# Patient Record
Sex: Female | Born: 1960 | Race: White | Hispanic: No | Marital: Married
Health system: Southern US, Community
[De-identification: ages and names within clinical notes are randomized; demographics above are authoritative.]

---

## 2012-09-08 ENCOUNTER — Other Ambulatory Visit: Payer: Self-pay | Admitting: Family Medicine

## 2012-09-08 DIAGNOSIS — Z1231 Encounter for screening mammogram for malignant neoplasm of breast: Secondary | ICD-10-CM

## 2012-10-20 ENCOUNTER — Ambulatory Visit
Admission: RE | Admit: 2012-10-20 | Discharge: 2012-10-20 | Disposition: A | Discharge status: Home / Self Care | Payer: BC Managed Care – PPO | Source: Ambulatory Visit | Attending: Family Medicine | Admitting: Family Medicine

## 2012-10-20 DIAGNOSIS — Z1231 Encounter for screening mammogram for malignant neoplasm of breast: Secondary | ICD-10-CM

## 2012-10-21 ENCOUNTER — Other Ambulatory Visit: Payer: Self-pay | Admitting: Family Medicine

## 2012-10-21 DIAGNOSIS — R928 Other abnormal and inconclusive findings on diagnostic imaging of breast: Secondary | ICD-10-CM

## 2012-11-02 ENCOUNTER — Other Ambulatory Visit: Payer: BC Managed Care – PPO

## 2012-11-19 ENCOUNTER — Ambulatory Visit
Admission: RE | Admit: 2012-11-19 | Discharge: 2012-11-19 | Disposition: A | Discharge status: Home / Self Care | Payer: BC Managed Care – PPO | Source: Ambulatory Visit | Attending: Family Medicine | Admitting: Family Medicine

## 2012-11-19 DIAGNOSIS — R928 Other abnormal and inconclusive findings on diagnostic imaging of breast: Secondary | ICD-10-CM

## 2013-09-09 ENCOUNTER — Other Ambulatory Visit (HOSPITAL_COMMUNITY)
Admission: RE | Admit: 2013-09-09 | Discharge: 2013-09-09 | Disposition: A | Discharge status: Home / Self Care | Payer: 59 | Source: Ambulatory Visit | Attending: Family Medicine | Admitting: Family Medicine

## 2013-09-09 ENCOUNTER — Other Ambulatory Visit: Payer: Self-pay | Admitting: Family Medicine

## 2013-09-09 DIAGNOSIS — Z1151 Encounter for screening for human papillomavirus (HPV): Secondary | ICD-10-CM | POA: Insufficient documentation

## 2013-09-09 DIAGNOSIS — Z0000 Encounter for general adult medical examination without abnormal findings: Secondary | ICD-10-CM | POA: Insufficient documentation

## 2013-09-13 LAB — CYTOLOGY - PAP

## 2013-12-19 ENCOUNTER — Other Ambulatory Visit: Payer: Self-pay

## 2013-12-19 DIAGNOSIS — Z1231 Encounter for screening mammogram for malignant neoplasm of breast: Secondary | ICD-10-CM

## 2013-12-28 ENCOUNTER — Ambulatory Visit
Admission: RE | Admit: 2013-12-28 | Discharge: 2013-12-28 | Disposition: A | Discharge status: Home / Self Care | Payer: 59 | Source: Ambulatory Visit

## 2013-12-28 ENCOUNTER — Encounter (INDEPENDENT_AMBULATORY_CARE_PROVIDER_SITE_OTHER): Payer: Self-pay

## 2013-12-28 DIAGNOSIS — Z1231 Encounter for screening mammogram for malignant neoplasm of breast: Secondary | ICD-10-CM

## 2016-02-19 ENCOUNTER — Other Ambulatory Visit: Payer: Self-pay | Admitting: Family Medicine

## 2016-02-19 DIAGNOSIS — Z1231 Encounter for screening mammogram for malignant neoplasm of breast: Secondary | ICD-10-CM

## 2016-02-27 ENCOUNTER — Ambulatory Visit
Admission: RE | Admit: 2016-02-27 | Discharge: 2016-02-27 | Disposition: A | Discharge status: Home / Self Care | Payer: 59 | Source: Ambulatory Visit | Attending: Family Medicine | Admitting: Family Medicine

## 2016-02-27 DIAGNOSIS — Z1231 Encounter for screening mammogram for malignant neoplasm of breast: Secondary | ICD-10-CM

## 2016-12-02 DIAGNOSIS — Z0000 Encounter for general adult medical examination without abnormal findings: Secondary | ICD-10-CM | POA: Diagnosis not present

## 2016-12-02 DIAGNOSIS — E782 Mixed hyperlipidemia: Secondary | ICD-10-CM | POA: Diagnosis not present

## 2016-12-02 DIAGNOSIS — I10 Essential (primary) hypertension: Secondary | ICD-10-CM | POA: Diagnosis not present

## 2017-02-17 ENCOUNTER — Other Ambulatory Visit: Payer: Self-pay | Admitting: Family Medicine

## 2017-02-17 DIAGNOSIS — Z1231 Encounter for screening mammogram for malignant neoplasm of breast: Secondary | ICD-10-CM

## 2017-03-17 ENCOUNTER — Ambulatory Visit
Admission: RE | Admit: 2017-03-17 | Discharge: 2017-03-17 | Disposition: A | Discharge status: Home / Self Care | Payer: 59 | Source: Ambulatory Visit | Attending: Family Medicine | Admitting: Family Medicine

## 2017-03-17 DIAGNOSIS — Z1231 Encounter for screening mammogram for malignant neoplasm of breast: Secondary | ICD-10-CM

## 2017-10-30 DIAGNOSIS — M1711 Unilateral primary osteoarthritis, right knee: Secondary | ICD-10-CM | POA: Diagnosis not present

## 2017-10-30 DIAGNOSIS — M25561 Pain in right knee: Secondary | ICD-10-CM | POA: Diagnosis not present

## 2017-10-30 DIAGNOSIS — M238X1 Other internal derangements of right knee: Secondary | ICD-10-CM | POA: Diagnosis not present

## 2017-11-10 DIAGNOSIS — M25561 Pain in right knee: Secondary | ICD-10-CM | POA: Diagnosis not present

## 2017-12-02 DIAGNOSIS — M25561 Pain in right knee: Secondary | ICD-10-CM | POA: Diagnosis not present

## 2017-12-02 DIAGNOSIS — S83231A Complex tear of medial meniscus, current injury, right knee, initial encounter: Secondary | ICD-10-CM | POA: Diagnosis not present

## 2017-12-02 DIAGNOSIS — M1711 Unilateral primary osteoarthritis, right knee: Secondary | ICD-10-CM | POA: Diagnosis not present

## 2017-12-11 DIAGNOSIS — M94261 Chondromalacia, right knee: Secondary | ICD-10-CM | POA: Diagnosis not present

## 2017-12-11 DIAGNOSIS — G8918 Other acute postprocedural pain: Secondary | ICD-10-CM | POA: Diagnosis not present

## 2017-12-11 DIAGNOSIS — M23221 Derangement of posterior horn of medial meniscus due to old tear or injury, right knee: Secondary | ICD-10-CM | POA: Diagnosis not present

## 2018-02-05 DIAGNOSIS — M25561 Pain in right knee: Secondary | ICD-10-CM | POA: Diagnosis not present

## 2018-02-05 DIAGNOSIS — M25661 Stiffness of right knee, not elsewhere classified: Secondary | ICD-10-CM | POA: Diagnosis not present

## 2018-02-18 DIAGNOSIS — M25661 Stiffness of right knee, not elsewhere classified: Secondary | ICD-10-CM | POA: Diagnosis not present

## 2018-02-18 DIAGNOSIS — M25561 Pain in right knee: Secondary | ICD-10-CM | POA: Diagnosis not present

## 2018-02-24 DIAGNOSIS — M25561 Pain in right knee: Secondary | ICD-10-CM | POA: Diagnosis not present

## 2019-04-11 ENCOUNTER — Other Ambulatory Visit (HOSPITAL_COMMUNITY)
Admission: RE | Admit: 2019-04-11 | Discharge: 2019-04-11 | Disposition: A | Discharge status: Home / Self Care | Payer: 59 | Source: Ambulatory Visit | Attending: Family Medicine | Admitting: Family Medicine

## 2019-04-11 ENCOUNTER — Other Ambulatory Visit: Payer: Self-pay | Admitting: Family Medicine

## 2019-04-11 DIAGNOSIS — Z124 Encounter for screening for malignant neoplasm of cervix: Secondary | ICD-10-CM | POA: Diagnosis not present

## 2019-04-15 LAB — CYTOLOGY - PAP
Comment: NEGATIVE
Diagnosis: NEGATIVE
High risk HPV: NEGATIVE

## 2019-08-25 ENCOUNTER — Other Ambulatory Visit: Payer: Self-pay | Admitting: Family Medicine

## 2019-08-25 DIAGNOSIS — Z1231 Encounter for screening mammogram for malignant neoplasm of breast: Secondary | ICD-10-CM

## 2019-08-30 ENCOUNTER — Other Ambulatory Visit: Payer: Self-pay

## 2019-08-30 ENCOUNTER — Ambulatory Visit
Admission: RE | Admit: 2019-08-30 | Discharge: 2019-08-30 | Disposition: A | Discharge status: Home / Self Care | Payer: 59 | Source: Ambulatory Visit | Attending: Family Medicine | Admitting: Family Medicine

## 2019-08-30 DIAGNOSIS — Z1231 Encounter for screening mammogram for malignant neoplasm of breast: Secondary | ICD-10-CM

## 2020-08-22 ENCOUNTER — Other Ambulatory Visit (HOSPITAL_COMMUNITY): Payer: Self-pay | Admitting: Family Medicine

## 2020-08-22 DIAGNOSIS — R011 Cardiac murmur, unspecified: Secondary | ICD-10-CM

## 2020-08-29 ENCOUNTER — Encounter (HOSPITAL_COMMUNITY): Payer: Self-pay | Admitting: Family Medicine

## 2020-09-11 ENCOUNTER — Telehealth (HOSPITAL_COMMUNITY): Payer: Self-pay | Admitting: Family Medicine

## 2020-09-11 NOTE — Telephone Encounter (Signed)
Just an FYI. We have made several attempts to contact this patient including sending a letter to schedule or reschedule their echocardiogram. We will be removing the patient from the echo WQ.  08/29/20 MAILED LETTER [facility]  08/29/20 [facility] to schedule @ 9:27/[facility]  08/24/20 [facility] to schedule @ 9:35/[facility]  08/22/20 [facility] to schedule @ 11:59/[facility]      Thank you

## 2021-08-11 IMAGING — MG DIGITAL SCREENING BILAT W/ TOMO W/ CAD
6 of 12 series · 6 of 36 positions shown · non-contrast
Comparison: Previous exam(s).

CLINICAL DATA: Screening.

EXAM:
DIGITAL SCREENING BILATERAL MAMMOGRAM WITH TOMO AND CAD

[R CC synth-2D (1 of 2)]
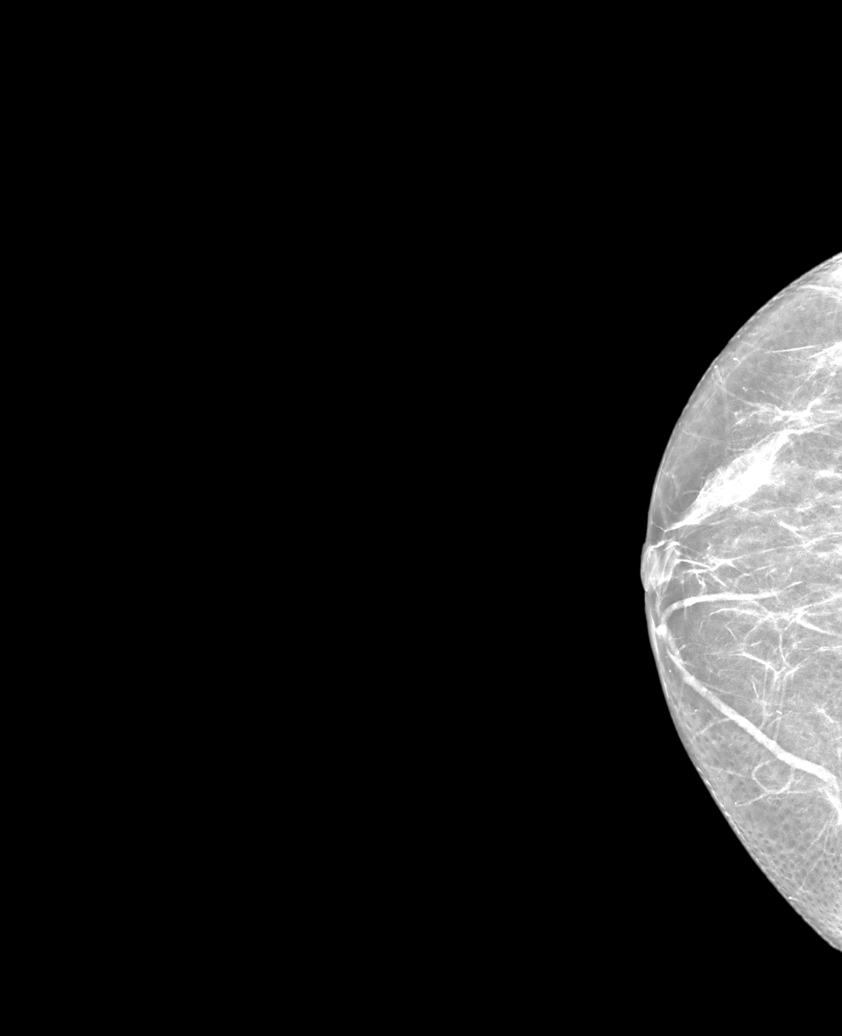

[L CC synth-2D (1 of 2)]
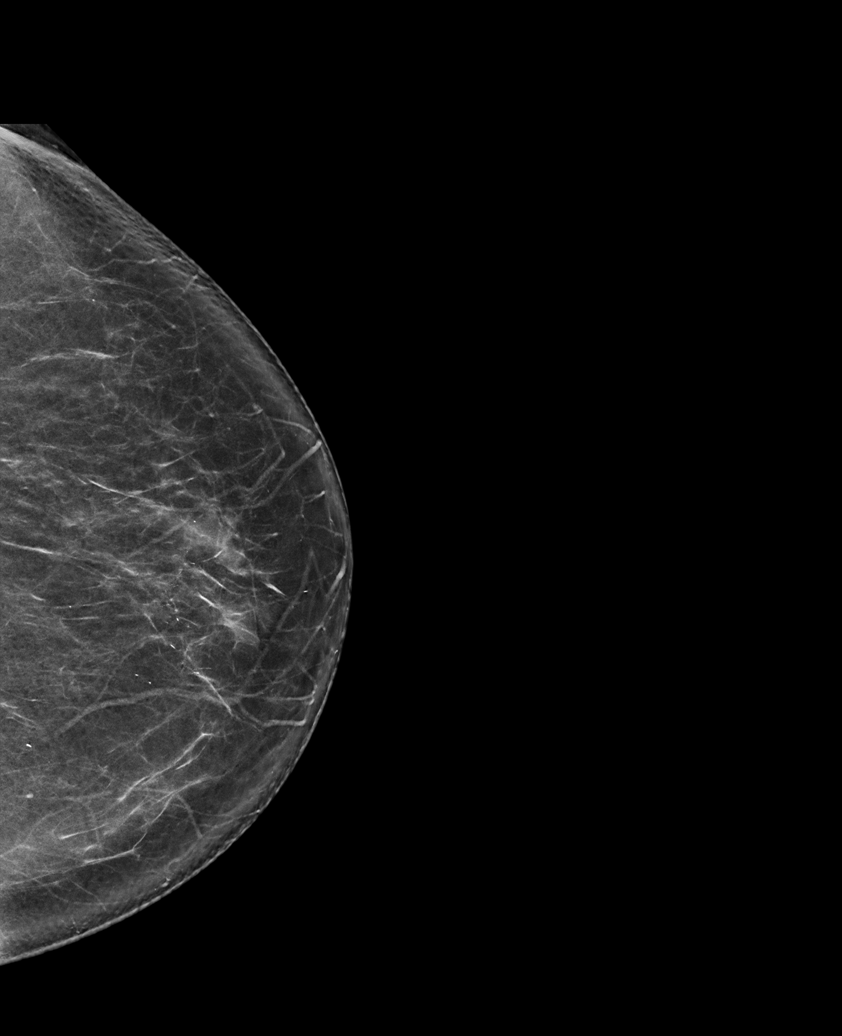

[R MLO synth-2D]
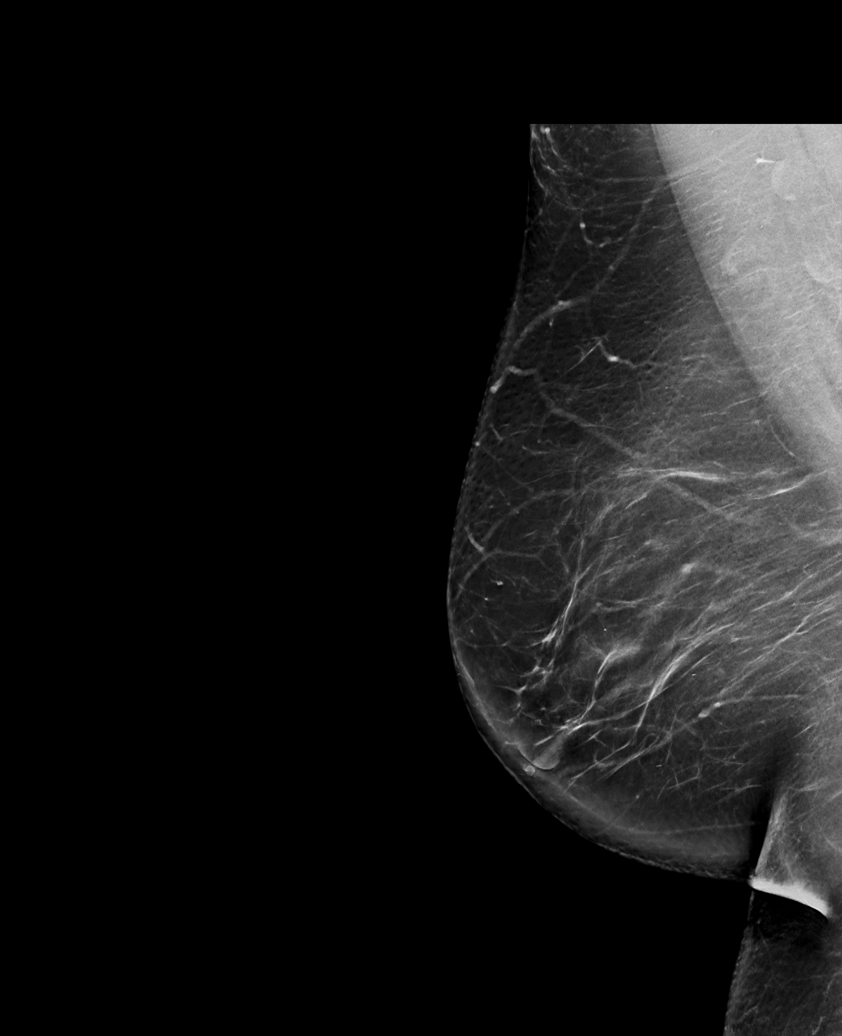

[R CC synth-2D (2 of 2)]
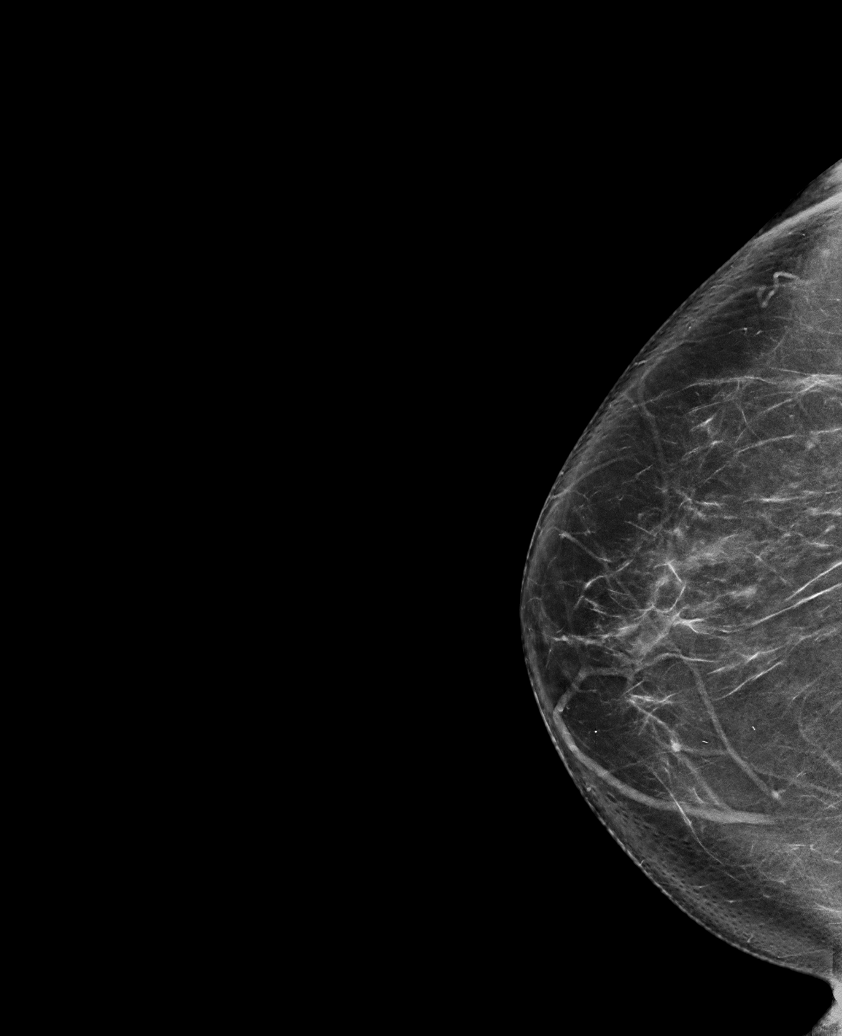

[L MLO synth-2D]
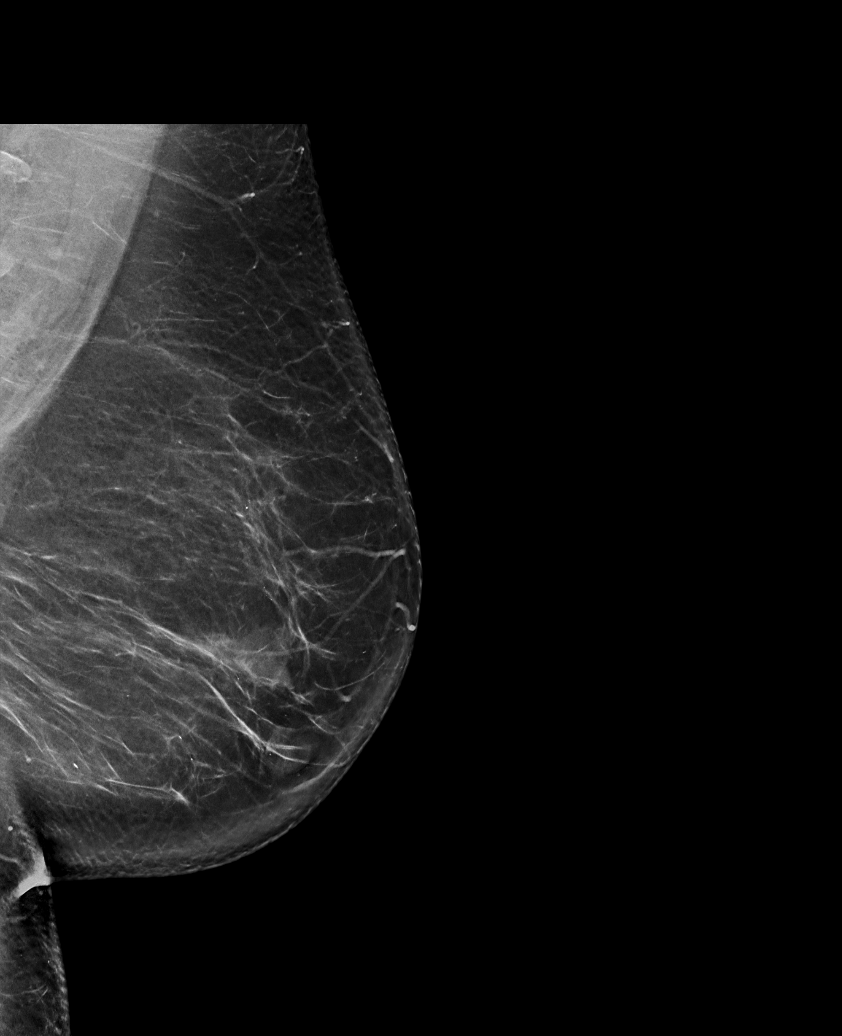

[L CC synth-2D (2 of 2)]
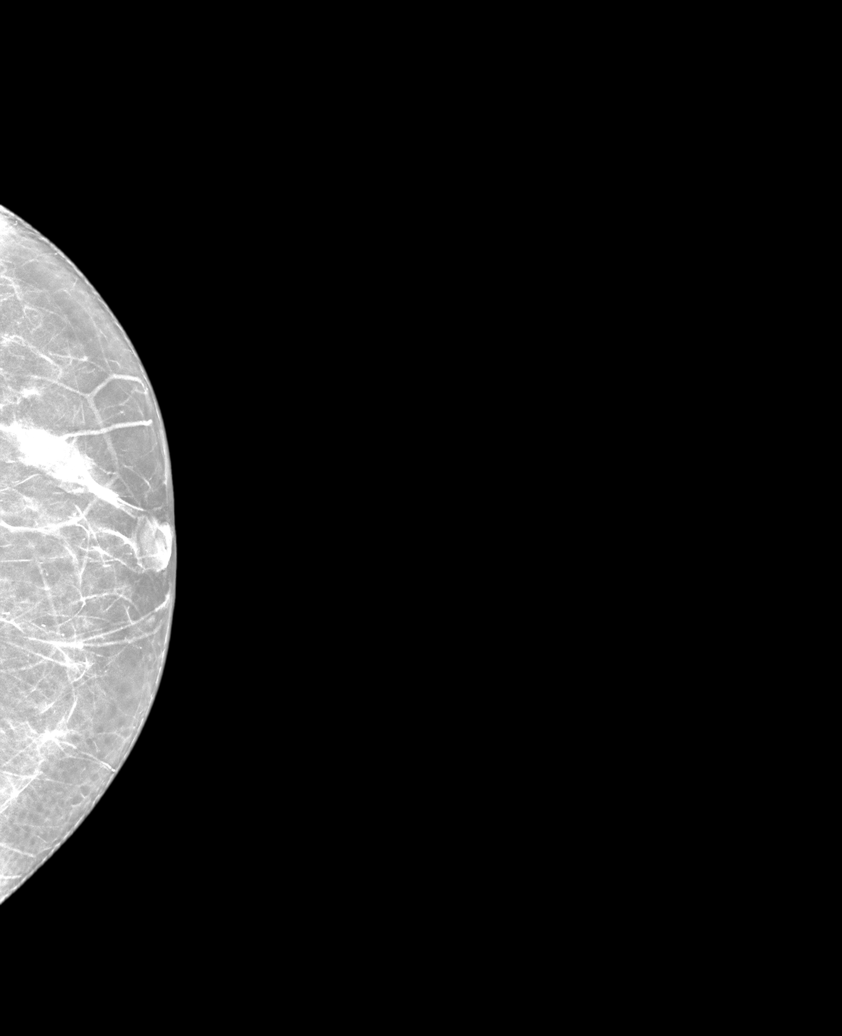

[6 of 36 positions shown; findings below may reference images not displayed]

ACR Breast Density Category b: There are scattered areas of
fibroglandular density.
FINDINGS: There are no findings suspicious for malignancy. Images were
processed with CAD.
IMPRESSION: No mammographic evidence of malignancy. A result letter of this
screening mammogram will be mailed directly to the patient.

RECOMMENDATION:
Screening mammogram in one year. (Code:[id number])

BI-RADS CATEGORY  1: Negative.

## 2022-01-30 ENCOUNTER — Other Ambulatory Visit: Payer: Self-pay | Admitting: Family Medicine

## 2022-01-30 DIAGNOSIS — Z1231 Encounter for screening mammogram for malignant neoplasm of breast: Secondary | ICD-10-CM

## 2022-02-10 ENCOUNTER — Ambulatory Visit
Admission: RE | Admit: 2022-02-10 | Discharge: 2022-02-10 | Disposition: A | Discharge status: Home / Self Care | Payer: 59 | Source: Ambulatory Visit | Attending: Family Medicine | Admitting: Family Medicine

## 2022-02-10 DIAGNOSIS — Z1231 Encounter for screening mammogram for malignant neoplasm of breast: Secondary | ICD-10-CM

## 2023-01-27 ENCOUNTER — Ambulatory Visit (HOSPITAL_BASED_OUTPATIENT_CLINIC_OR_DEPARTMENT_OTHER)
Admission: RE | Admit: 2023-01-27 | Discharge: 2023-01-27 | Disposition: A | Discharge status: Home / Self Care | Payer: 59 | Source: Ambulatory Visit | Attending: Student | Admitting: Student

## 2023-01-27 ENCOUNTER — Encounter (HOSPITAL_BASED_OUTPATIENT_CLINIC_OR_DEPARTMENT_OTHER): Payer: Self-pay | Admitting: Student

## 2023-01-27 ENCOUNTER — Other Ambulatory Visit (HOSPITAL_BASED_OUTPATIENT_CLINIC_OR_DEPARTMENT_OTHER): Payer: Self-pay

## 2023-01-27 ENCOUNTER — Ambulatory Visit (HOSPITAL_BASED_OUTPATIENT_CLINIC_OR_DEPARTMENT_OTHER): Payer: 59 | Admitting: Student

## 2023-01-27 DIAGNOSIS — M79601 Pain in right arm: Secondary | ICD-10-CM | POA: Insufficient documentation

## 2023-01-27 MED ORDER — MELOXICAM 15 MG PO TABS: 15.0000 mg | ORAL_TABLET | Freq: Every day | ORAL | 0 refills | Status: AC

## 2023-01-27 NOTE — Progress Notes (Signed)
Chief Complaint: Right arm pain     History of Present Illness:    Tracy Gordon is a 62 y.o. female presenting today for evaluation of right arm pain after a fall yesterday.  Patient states she tripped while reaching into her shower to grab something and fell into the wall.  She is unsure if she hit the wall directly or tried to brace herself with her right arm.  States that he has moderate pain in her upper arm but denies any pain at the shoulder or elbow.  She does get relief with her arm at a 90 degree angle up against her body.  Pain is worsened with external and internal rotation.  Denies any numbness or tingling.  She has tried icing and taking ibuprofen.  She is right-hand dominant.  Surgical History:   None  PMH/PSH/Family History/Social History/Meds/Allergies:   History reviewed. No pertinent past medical history. History reviewed. No pertinent surgical history. Social History   Socioeconomic History   Marital status: Married    Spouse name: Not on file   Number of children: Not on file   Years of education: Not on file   Highest education level: Not on file  Occupational History   Not on file  Tobacco Use   Smoking status: Not on file   Smokeless tobacco: Not on file  Substance and Sexual Activity   Alcohol use: Not on file   Drug use: Not on file   Sexual activity: Not on file  Other Topics Concern   Not on file  Social History Narrative   Not on file   Social Determinants of Health   Financial Resource Strain: Not on file  Food Insecurity: Not on file  Transportation Needs: Not on file  Physical Activity: Not on file  Stress: Not on file  Social Connections: Not on file   History reviewed. No pertinent family history. No Known Allergies Current Outpatient Medications  Medication Sig Dispense Refill   meloxicam (MOBIC) 15 MG tablet Take 1 tablet (15 mg total) by mouth daily for 10 days. 10 tablet 0   No current  facility-administered medications for this visit.   No results found.  Review of Systems:   A ROS was performed including pertinent positives and negatives as documented in the HPI.  Physical Exam :   Constitutional: NAD and appears stated age Neurological: Alert and oriented Psych: Appropriate affect and cooperative Last menstrual period 08/05/2012.   Comprehensive Musculoskeletal Exam:    Tenderness over the proximal and midportion right biceps muscle.  Active right shoulder ROM to 160 degrees forward flexion, 20 degrees ER, and IR to the front pocket.  Pain noted with shoulder ER and IR.  No tenderness over the shoulder or lateral deltoid.  Positive speeds.  Negative empty can, Neer and Hawkins, and cross body adduction.  Full elbow ROM from 0 to 120 degrees without pain or tenderness.  Imaging:   Xray (right humerus 2 views): Negative   I personally reviewed and interpreted the radiographs.   Assessment:   62 y.o. female with acute right upper extremity pain after a fall at home yesterday.  Radiographs taken today do not show any evidence of fracture or dislocation.  Given her location of pain I am suspicious of involvement of the biceps, likely acute tendinitis due to  her maintaining good strength.  Unable to completely rule out any type of rotator cuff involvement due to increased pain with internal and external rotation of the shoulder however she also has good strength on testing.  Discussed treatment options and ultimately decided we will begin treatment conservatively.  I will send her in some meloxicam as well as a sling she can use when needed.  Discussed that should symptoms not improve over the next few weeks I would recommend follow-up and further workup.  Plan :    -Start meloxicam 15 mg -Use of sling as needed -Return to clinic within 4 weeks for reevaluation should symptoms fail to improve     I personally saw and evaluated the patient, and participated in the  management and treatment plan.  Hazle Nordmann, PA-C Orthopedics

## 2023-02-03 ENCOUNTER — Other Ambulatory Visit (HOSPITAL_BASED_OUTPATIENT_CLINIC_OR_DEPARTMENT_OTHER): Payer: Self-pay | Admitting: Student

## 2023-02-20 ENCOUNTER — Telehealth: Payer: Self-pay | Admitting: Student

## 2023-02-20 NOTE — Telephone Encounter (Signed)
Patient added to schedule.

## 2023-02-20 NOTE — Telephone Encounter (Signed)
Patient called and she said that [place] told her to call back if her arm isn't better. CB#[phone]

## 2023-02-20 NOTE — Telephone Encounter (Signed)
Spoke to patient this afternoon.  Scheduled follow up eval for 11/18 at 10am.

## 2023-02-24 ENCOUNTER — Encounter (HOSPITAL_BASED_OUTPATIENT_CLINIC_OR_DEPARTMENT_OTHER): Payer: Self-pay | Admitting: Student

## 2023-02-24 ENCOUNTER — Ambulatory Visit (HOSPITAL_BASED_OUTPATIENT_CLINIC_OR_DEPARTMENT_OTHER): Payer: 59 | Admitting: Student

## 2023-02-24 DIAGNOSIS — M79601 Pain in right arm: Secondary | ICD-10-CM

## 2023-02-24 NOTE — Progress Notes (Signed)
Chief Complaint: Right arm pain     History of Present Illness:   02/24/23: Patient is here today for follow up of her right arm pain.  This continues to be in the medial and anterior aspects of the proximal upper arm.  Rates pain today at a 5/10 without any significant improvement since last visit.  This does get worse at night states that it often throbs.  She did develop some bruising in the arm shortly after I saw her last.  Did not get a lot of relief from meloxicam or Tylenol.  She does have particular pain when turning the steering well in her car as well as writing.  She is leaving for a trip to [place] on December 4.   01/27/23: Alyxandria Dau is a 62 y.o. female presenting today for evaluation of right arm pain after a fall yesterday.  Patient states she tripped while reaching into her shower to grab something and fell into the wall.  She is unsure if she hit the wall directly or tried to brace herself with her right arm.  States that he has moderate pain in her upper arm but denies any pain at the shoulder or elbow.  She does get relief with her arm at a 90 degree angle up against her body.  Pain is worsened with external and internal rotation.  Denies any numbness or tingling.  She has tried icing and taking ibuprofen.  She is right-hand dominant.  Surgical History:   None  PMH/PSH/Family History/Social History/Meds/Allergies:   History reviewed. No pertinent past medical history. History reviewed. No pertinent surgical history. Social History   Socioeconomic History   Marital status: Married    Spouse name: Not on file   Number of children: Not on file   Years of education: Not on file   Highest education level: Not on file  Occupational History   Not on file  Tobacco Use   Smoking status: Not on file   Smokeless tobacco: Not on file  Substance and Sexual Activity   Alcohol use: Not on file   Drug use: Not on file   Sexual activity: Not  on file  Other Topics Concern   Not on file  Social History Narrative   Not on file   Social Determinants of Health   Financial Resource Strain: Not on file  Food Insecurity: Not on file  Transportation Needs: Not on file  Physical Activity: Not on file  Stress: Not on file  Social Connections: Not on file   History reviewed. No pertinent family history. No Known Allergies No current outpatient medications on file.   No current facility-administered medications for this visit.   No results found.  Review of Systems:   A ROS was performed including pertinent positives and negatives as documented in the HPI.  Physical Exam :   Constitutional: NAD and appears stated age Neurological: Alert and oriented Psych: Appropriate affect and cooperative Last menstrual period 08/05/2012.   Comprehensive Musculoskeletal Exam:    No evidence of erythema or ecchymosis of the right shoulder or upper arm.  Active shoulder range of motion to 160 degrees forward flexion, 30 degrees external rotation, and internal rotation behind the back limited to the back pocket.  Negative empty can and impingement signs.  Unable to perform liftoff test and  mildly positive Speeds.  Imaging:    Assessment:   62 y.o. female with 1 month history of acute right shoulder/proximal upper arm pain due to an injury.  She has not gotten any significant improvement in symptoms over this time.  At this point I am concerned for the potential subscapularis and/or proximal biceps injury.  For this reason I would like to obtain an MRI for further evaluation and we will plan to order stat as she is leaving the country in a few weeks.  She does have good strength and range of motion with flexion and external rotation.  Will plan to see her back shortly after MRI for review and treatment discussion.  Plan :    -Obtain STAT MRI of the right shoulder and return for review/treatment discussion     I personally saw and  evaluated the patient, and participated in the management and treatment plan.  Hazle Nordmann, PA-C Orthopedics

## 2023-02-27 ENCOUNTER — Ambulatory Visit
Admission: RE | Admit: 2023-02-27 | Discharge: 2023-02-27 | Disposition: A | Discharge status: Home / Self Care | Payer: 59 | Source: Ambulatory Visit | Attending: Student

## 2023-02-27 DIAGNOSIS — M79601 Pain in right arm: Secondary | ICD-10-CM

## 2023-03-02 ENCOUNTER — Telehealth (HOSPITAL_BASED_OUTPATIENT_CLINIC_OR_DEPARTMENT_OTHER): Payer: Self-pay

## 2023-03-02 NOTE — Telephone Encounter (Signed)
Tried Reaching out to patient no answer. Do you mind trying to get in touch with her to schedule an appointment for Wednesday 27th in the afternoon.

## 2023-03-02 NOTE — Telephone Encounter (Signed)
-----   Message from Amador Cunas sent at 03/02/2023 10:17 AM EST ----- Can we see if she is available to come in on Wednesday? ----- Message ----- From: Interface, Rad Results In Sent: 02/27/2023   5:36 PM EST To: Amador Cunas, PA-C

## 2023-03-04 ENCOUNTER — Encounter (HOSPITAL_BASED_OUTPATIENT_CLINIC_OR_DEPARTMENT_OTHER): Payer: Self-pay | Admitting: Student

## 2023-03-04 ENCOUNTER — Ambulatory Visit (HOSPITAL_BASED_OUTPATIENT_CLINIC_OR_DEPARTMENT_OTHER): Payer: 59 | Admitting: Orthopaedic Surgery

## 2023-03-04 ENCOUNTER — Ambulatory Visit (HOSPITAL_BASED_OUTPATIENT_CLINIC_OR_DEPARTMENT_OTHER): Payer: Self-pay | Admitting: Orthopaedic Surgery

## 2023-03-04 DIAGNOSIS — S46811A Strain of other muscles, fascia and tendons at shoulder and upper arm level, right arm, initial encounter: Secondary | ICD-10-CM

## 2023-03-04 NOTE — Progress Notes (Signed)
Chief Complaint: Right arm pain        History of Present Illness:    03/04/2023: Presents today for follow-up of her right shoulder MRI.  She is still having persistent pain  02/24/23: Patient is here today for follow up of her right arm pain.  This continues to be in the medial and anterior aspects of the proximal upper arm.  Rates pain today at a 5/10 without any significant improvement since last visit.  This does get worse at night states that it often throbs.  She did develop some bruising in the arm shortly after I saw her last.  Did not get a lot of relief from meloxicam or Tylenol.  She does have particular pain when turning the steering well in her car as well as writing.  She is leaving for a trip to [place] on December 4.     01/27/23: Tracy Gordon is a 62 y.o. female presenting today for evaluation of right arm pain after a fall yesterday.  Patient states she tripped while reaching into her shower to grab something and fell into the wall.  She is unsure if she hit the wall directly or tried to brace herself with her right arm.  States that he has moderate pain in her upper arm but denies any pain at the shoulder or elbow.  She does get relief with her arm at a 90 degree angle up against her body.  Pain is worsened with external and internal rotation.  Denies any numbness or tingling.  She has tried icing and taking ibuprofen.  She is right-hand dominant.   Surgical History:   None   PMH/PSH/Family History/Social History/Meds/Allergies:   History reviewed. No pertinent past medical history.     History reviewed. No pertinent surgical history.     Social History         Socioeconomic History   Marital status: Married      Spouse name: Not on file   Number of children: Not on file   Years of education: Not on file   Highest education level: Not on file  Occupational History   Not on file  Tobacco Use   Smoking status: Not on file   Smokeless  tobacco: Not on file  Substance and Sexual Activity   Alcohol use: Not on file   Drug use: Not on file   Sexual activity: Not on file  Other Topics Concern   Not on file  Social History Narrative   Not on file    Social Determinants of Health    Financial Resource Strain: Not on file  Food Insecurity: Not on file  Transportation Needs: Not on file  Physical Activity: Not on file  Stress: Not on file  Social Connections: Not on file    History reviewed. No pertinent family history.     Allergies  No Known Allergies   No current outpatient medications on file.      No current facility-administered medications for this visit.      Imaging Results (Last 48 hours)  No results found.     Review of Systems:   A ROS was performed including pertinent positives and negatives as documented in the HPI.   Physical Exam :   Constitutional: NAD and appears stated age Neurological: Alert and oriented Psych: Appropriate affect and cooperative Last menstrual period 08/05/2012.    Comprehensive Musculoskeletal Exam:     No evidence of erythema or ecchymosis of the right  shoulder or upper arm.  Active shoulder range of motion to 160 degrees forward flexion, 30 degrees external rotation, and internal rotation behind the back limited to the back pocket.  Negative empty can and impingement signs.  Unable to perform liftoff test and mildly positive Speeds.   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 female with 1 month history of acute right shoulder/proximal upper arm pain due to an injury.  MRI confirms full-thickness subscapularis tear.  At this time she has loss active internal range of motion which is severely inhibiting her ability to perform activities around the back.  Given the fact that this is an acute traumatic tear I did discuss the role for shoulder arthroscopy with  subscapularis repair.  I discussed that we would perform biceps tenodesis if there was significant tearing or subluxation of this.  I discussed the risks and limitations.  I did discus the rehab protocol and timeframe.    Plan :     -Plan for right shoulder arthroscopy with subscapularis repair and possible biceps tenodesis   After a lengthy discussion of treatment options, including risks, benefits, alternatives, complications of surgical and nonsurgical conservative options, the patient elected surgical repair.   The patient  is aware of the material risks  and complications including, but not limited to injury to adjacent structures, neurovascular injury, infection, numbness, bleeding, implant failure, thermal burns, stiffness, persistent pain, failure to heal, disease transmission from allograft, need for further surgery, dislocation, anesthetic risks, blood clots, risks of death,and others. The probabilities of surgical success and failure discussed with patient given their particular co-morbidities.The time and nature of expected rehabilitation and recovery was discussed.The patient's questions were all answered preoperatively.  No barriers to understanding were noted. I explained the natural history of the disease process and Rx rationale.  I explained to the patient what I considered to be reasonable expectations given their personal situation.  The final treatment plan was arrived at through a shared patient decision making process model.          I personally saw and evaluated the patient, and participated in the management and treatment plan.

## 2023-05-08 ENCOUNTER — Other Ambulatory Visit (HOSPITAL_BASED_OUTPATIENT_CLINIC_OR_DEPARTMENT_OTHER): Payer: Self-pay | Admitting: Family Medicine

## 2023-05-08 DIAGNOSIS — E785 Hyperlipidemia, unspecified: Secondary | ICD-10-CM

## 2023-05-12 ENCOUNTER — Encounter (HOSPITAL_BASED_OUTPATIENT_CLINIC_OR_DEPARTMENT_OTHER): Payer: Self-pay | Admitting: Radiology

## 2023-05-12 ENCOUNTER — Ambulatory Visit (HOSPITAL_BASED_OUTPATIENT_CLINIC_OR_DEPARTMENT_OTHER)
Admission: RE | Admit: 2023-05-12 | Discharge: 2023-05-12 | Disposition: A | Discharge status: Home / Self Care | Payer: 59 | Source: Ambulatory Visit | Attending: Family Medicine | Admitting: Family Medicine

## 2023-05-12 DIAGNOSIS — R92323 Mammographic fibroglandular density, bilateral breasts: Secondary | ICD-10-CM | POA: Diagnosis not present

## 2023-05-12 DIAGNOSIS — Z1231 Encounter for screening mammogram for malignant neoplasm of breast: Secondary | ICD-10-CM | POA: Diagnosis present

## 2024-02-08 ENCOUNTER — Encounter: Payer: Self-pay | Admitting: Radiology
